# Patient Record
Sex: Female | Born: 1980 | Hispanic: Yes | Marital: Single
Health system: Southern US, Community
[De-identification: ages and names within clinical notes are randomized; demographics above are authoritative.]

---

## 2004-06-10 ENCOUNTER — Ambulatory Visit: Payer: Self-pay | Admitting: Family Medicine

## 2004-07-27 ENCOUNTER — Observation Stay: Payer: Self-pay | Admitting: Obstetrics and Gynecology

## 2004-08-01 ENCOUNTER — Observation Stay: Payer: Self-pay | Admitting: Obstetrics and Gynecology

## 2004-09-08 ENCOUNTER — Inpatient Hospital Stay: Payer: Self-pay

## 2007-04-26 ENCOUNTER — Emergency Department: Payer: Self-pay | Admitting: Emergency Medicine

## 2007-04-28 ENCOUNTER — Emergency Department: Payer: Self-pay | Admitting: Emergency Medicine

## 2007-04-29 ENCOUNTER — Emergency Department: Payer: Self-pay | Admitting: Emergency Medicine

## 2013-12-29 ENCOUNTER — Encounter: Payer: Self-pay | Admitting: Obstetrics and Gynecology

## 2014-01-06 ENCOUNTER — Emergency Department: Payer: Self-pay | Admitting: Emergency Medicine

## 2014-01-06 LAB — URINALYSIS, COMPLETE
Bacteria: NONE SEEN
Bilirubin,UR: NEGATIVE
GLUCOSE, UR: NEGATIVE mg/dL (ref 0–75)
KETONE: NEGATIVE
Leukocyte Esterase: NEGATIVE
Nitrite: NEGATIVE
Ph: 6 (ref 4.5–8.0)
Protein: NEGATIVE
RBC,UR: 1 /HPF (ref 0–5)
Specific Gravity: 1.015 (ref 1.003–1.030)

## 2014-01-06 LAB — CBC
HCT: 37.5 % (ref 35.0–47.0)
HGB: 12.4 g/dL (ref 12.0–16.0)
MCH: 30.6 pg (ref 26.0–34.0)
MCHC: 33 g/dL (ref 32.0–36.0)
MCV: 93 fL (ref 80–100)
Platelet: 148 10*3/uL — ABNORMAL LOW (ref 150–440)
RBC: 4.04 10*6/uL (ref 3.80–5.20)
RDW: 13.1 % (ref 11.5–14.5)
WBC: 9.5 10*3/uL (ref 3.6–11.0)

## 2014-01-06 LAB — COMPREHENSIVE METABOLIC PANEL
ANION GAP: 6 — AB (ref 7–16)
Albumin: 3.4 g/dL (ref 3.4–5.0)
Alkaline Phosphatase: 43 U/L — ABNORMAL LOW
BUN: 8 mg/dL (ref 7–18)
Bilirubin,Total: 0.4 mg/dL (ref 0.2–1.0)
CREATININE: 0.42 mg/dL — AB (ref 0.60–1.30)
Calcium, Total: 8.6 mg/dL (ref 8.5–10.1)
Chloride: 104 mmol/L (ref 98–107)
Co2: 24 mmol/L (ref 21–32)
EGFR (Non-African Amer.): >60
GLUCOSE: 84 mg/dL (ref 65–99)
OSMOLALITY: 266 (ref 275–301)
Potassium: 3.4 mmol/L — ABNORMAL LOW (ref 3.5–5.1)
SGOT(AST): 17 U/L (ref 15–37)
SGPT (ALT): 23 U/L (ref 12–78)
SODIUM: 134 mmol/L — AB (ref 136–145)
TOTAL PROTEIN: 7.1 g/dL (ref 6.4–8.2)

## 2014-02-03 ENCOUNTER — Emergency Department: Payer: Self-pay | Admitting: Emergency Medicine

## 2014-02-03 LAB — CBC WITH DIFFERENTIAL/PLATELET
BASOS ABS: 0 10*3/uL (ref 0.0–0.1)
BASOS PCT: 0.3 %
EOS ABS: 0.1 10*3/uL (ref 0.0–0.7)
EOS PCT: 0.8 %
HCT: 34.1 % — ABNORMAL LOW (ref 35.0–47.0)
HGB: 11.2 g/dL — ABNORMAL LOW (ref 12.0–16.0)
LYMPHS PCT: 25.7 %
Lymphocyte #: 1.9 10*3/uL (ref 1.0–3.6)
MCH: 30.9 pg (ref 26.0–34.0)
MCHC: 33 g/dL (ref 32.0–36.0)
MCV: 94 fL (ref 80–100)
Monocyte #: 0.6 x10 3/mm (ref 0.2–0.9)
Monocyte %: 7.6 %
NEUTROS ABS: 4.8 10*3/uL (ref 1.4–6.5)
Neutrophil %: 65.6 %
PLATELETS: 137 10*3/uL — AB (ref 150–440)
RBC: 3.64 10*6/uL — AB (ref 3.80–5.20)
RDW: 13.7 % (ref 11.5–14.5)
WBC: 7.3 10*3/uL (ref 3.6–11.0)

## 2014-02-03 LAB — URINALYSIS, COMPLETE
BACTERIA: NONE SEEN
BLOOD: NEGATIVE
Bilirubin,UR: NEGATIVE
Glucose,UR: 50 mg/dL (ref 0–75)
Ketone: NEGATIVE
Nitrite: NEGATIVE
Ph: 6 (ref 4.5–8.0)
Protein: NEGATIVE
RBC,UR: 1 /HPF (ref 0–5)
Specific Gravity: 1.028 (ref 1.003–1.030)
WBC UR: 9 /HPF (ref 0–5)

## 2014-02-03 LAB — COMPREHENSIVE METABOLIC PANEL
ALT: 28 U/L (ref 12–78)
ANION GAP: 1 — AB (ref 7–16)
AST: 30 U/L (ref 15–37)
Albumin: 3.2 g/dL — ABNORMAL LOW (ref 3.4–5.0)
Alkaline Phosphatase: 59 U/L
BUN: 9 mg/dL (ref 7–18)
Bilirubin,Total: 0.4 mg/dL (ref 0.2–1.0)
CHLORIDE: 110 mmol/L — AB (ref 98–107)
CREATININE: 0.67 mg/dL (ref 0.60–1.30)
Calcium, Total: 8.3 mg/dL — ABNORMAL LOW (ref 8.5–10.1)
Co2: 25 mmol/L (ref 21–32)
EGFR (Non-African Amer.): >60
Glucose: 94 mg/dL (ref 65–99)
OSMOLALITY: 270 (ref 275–301)
POTASSIUM: 3.1 mmol/L — AB (ref 3.5–5.1)
Sodium: 136 mmol/L (ref 136–145)
TOTAL PROTEIN: 7.1 g/dL (ref 6.4–8.2)

## 2014-02-03 LAB — HCG, QUANTITATIVE, PREGNANCY: BETA HCG, QUANT.: 14634 m[IU]/mL — AB

## 2014-02-09 ENCOUNTER — Encounter: Payer: Self-pay | Admitting: Obstetrics and Gynecology

## 2014-05-08 ENCOUNTER — Observation Stay: Payer: Self-pay | Admitting: Obstetrics and Gynecology

## 2014-05-08 LAB — COMPREHENSIVE METABOLIC PANEL
ALK PHOS: 103 U/L
ALT: 21 U/L
AST: 19 U/L (ref 15–37)
Albumin: 2.4 g/dL — ABNORMAL LOW (ref 3.4–5.0)
Anion Gap: 7 (ref 7–16)
BUN: 9 mg/dL (ref 7–18)
Bilirubin,Total: 0.4 mg/dL (ref 0.2–1.0)
CALCIUM: 8 mg/dL — AB (ref 8.5–10.1)
CREATININE: 0.53 mg/dL — AB (ref 0.60–1.30)
Chloride: 107 mmol/L (ref 98–107)
Co2: 25 mmol/L (ref 21–32)
EGFR (African American): >60
EGFR (Non-African Amer.): >60
Glucose: 109 mg/dL — ABNORMAL HIGH (ref 65–99)
Osmolality: 277 (ref 275–301)
Potassium: 4 mmol/L (ref 3.5–5.1)
SODIUM: 139 mmol/L (ref 136–145)
Total Protein: 5.9 g/dL — ABNORMAL LOW (ref 6.4–8.2)

## 2014-05-08 LAB — URINALYSIS, COMPLETE
Bilirubin,UR: NEGATIVE
Blood: NEGATIVE
Glucose,UR: NEGATIVE mg/dL (ref 0–75)
Ketone: NEGATIVE
Leukocyte Esterase: NEGATIVE
NITRITE: NEGATIVE
Ph: 6 (ref 4.5–8.0)
RBC,UR: 5 /HPF (ref 0–5)
SPECIFIC GRAVITY: 1.026 (ref 1.003–1.030)
Squamous Epithelial: 6

## 2014-05-08 LAB — AMYLASE: AMYLASE: 88 U/L (ref 25–115)

## 2014-05-08 LAB — LIPASE, BLOOD: Lipase: 242 U/L (ref 73–393)

## 2014-05-09 LAB — BASIC METABOLIC PANEL
ANION GAP: 9 (ref 7–16)
BUN: 7 mg/dL (ref 7–18)
CHLORIDE: 112 mmol/L — AB (ref 98–107)
Calcium, Total: 7.3 mg/dL — ABNORMAL LOW (ref 8.5–10.1)
Co2: 22 mmol/L (ref 21–32)
Creatinine: 0.58 mg/dL — ABNORMAL LOW (ref 0.60–1.30)
EGFR (Non-African Amer.): >60
GLUCOSE: 87 mg/dL (ref 65–99)
OSMOLALITY: 282 (ref 275–301)
Potassium: 3.7 mmol/L (ref 3.5–5.1)
SODIUM: 143 mmol/L (ref 136–145)

## 2014-05-09 LAB — CBC WITH DIFFERENTIAL/PLATELET
BASOS ABS: 0.1 10*3/uL (ref 0.0–0.1)
Basophil %: 0.7 %
Eosinophil #: 0.1 10*3/uL (ref 0.0–0.7)
Eosinophil %: 1.9 %
HCT: 27.9 % — AB (ref 35.0–47.0)
HGB: 9.2 g/dL — ABNORMAL LOW (ref 12.0–16.0)
LYMPHS ABS: 2.3 10*3/uL (ref 1.0–3.6)
LYMPHS PCT: 31.6 %
MCH: 30.3 pg (ref 26.0–34.0)
MCHC: 32.9 g/dL (ref 32.0–36.0)
MCV: 92 fL (ref 80–100)
MONOS PCT: 9.1 %
Monocyte #: 0.7 x10 3/mm (ref 0.2–0.9)
NEUTROS PCT: 56.7 %
Neutrophil #: 4.2 10*3/uL (ref 1.4–6.5)
PLATELETS: 148 10*3/uL — AB (ref 150–440)
RBC: 3.02 10*6/uL — ABNORMAL LOW (ref 3.80–5.20)
RDW: 13.1 % (ref 11.5–14.5)
WBC: 7.4 10*3/uL (ref 3.6–11.0)

## 2014-06-23 ENCOUNTER — Observation Stay: Payer: Self-pay | Admitting: Obstetrics and Gynecology

## 2014-06-29 ENCOUNTER — Encounter: Payer: Self-pay | Admitting: Maternal & Fetal Medicine

## 2014-06-29 LAB — URINALYSIS, COMPLETE
BACTERIA: NONE SEEN
BILIRUBIN, UR: NEGATIVE
GLUCOSE, UR: NEGATIVE mg/dL (ref 0–75)
Ketone: NEGATIVE
Nitrite: NEGATIVE
PROTEIN: NEGATIVE
Ph: 7 (ref 4.5–8.0)
RBC,UR: <1 /HPF (ref 0–5)
SPECIFIC GRAVITY: 1.004 (ref 1.003–1.030)
Squamous Epithelial: 7
WBC UR: 5 /HPF (ref 0–5)

## 2014-06-29 LAB — CBC WITH DIFFERENTIAL/PLATELET
BASOS PCT: 0.4 %
Basophil #: 0 10*3/uL (ref 0.0–0.1)
Eosinophil #: 0.4 10*3/uL (ref 0.0–0.7)
Eosinophil %: 4.3 %
HCT: 33.8 % — ABNORMAL LOW (ref 35.0–47.0)
HGB: 10.9 g/dL — ABNORMAL LOW (ref 12.0–16.0)
LYMPHS ABS: 1.5 10*3/uL (ref 1.0–3.6)
Lymphocyte %: 17.8 %
MCH: 29.4 pg (ref 26.0–34.0)
MCHC: 32.3 g/dL (ref 32.0–36.0)
MCV: 91 fL (ref 80–100)
MONOS PCT: 7.6 %
Monocyte #: 0.6 x10 3/mm (ref 0.2–0.9)
NEUTROS ABS: 5.9 10*3/uL (ref 1.4–6.5)
NEUTROS PCT: 69.9 %
PLATELETS: 193 10*3/uL (ref 150–440)
RBC: 3.72 10*6/uL — AB (ref 3.80–5.20)
RDW: 14.1 % (ref 11.5–14.5)
WBC: 8.5 10*3/uL (ref 3.6–11.0)

## 2014-06-29 LAB — HEPATIC FUNCTION PANEL A (ARMC)
AST: 33 U/L (ref 15–37)
Albumin: 2.8 g/dL — ABNORMAL LOW (ref 3.4–5.0)
Alkaline Phosphatase: 218 U/L — ABNORMAL HIGH
Bilirubin, Direct: <0.1 mg/dL (ref 0.0–0.2)
Bilirubin,Total: 0.5 mg/dL (ref 0.2–1.0)
SGPT (ALT): 27 U/L
Total Protein: 7 g/dL (ref 6.4–8.2)

## 2014-07-04 ENCOUNTER — Inpatient Hospital Stay: Payer: Self-pay | Admitting: Obstetrics and Gynecology

## 2014-07-04 LAB — CBC WITH DIFFERENTIAL/PLATELET
Basophil #: 0.1 10*3/uL (ref 0.0–0.1)
Basophil %: 1.1 %
Eosinophil #: 0 10*3/uL (ref 0.0–0.7)
Eosinophil %: 0.5 %
HCT: 34.4 % — ABNORMAL LOW (ref 35.0–47.0)
HGB: 11 g/dL — ABNORMAL LOW (ref 12.0–16.0)
Lymphocyte #: 2.4 10*3/uL (ref 1.0–3.6)
Lymphocyte %: 29.1 %
MCH: 28.9 pg (ref 26.0–34.0)
MCHC: 31.9 g/dL — ABNORMAL LOW (ref 32.0–36.0)
MCV: 91 fL (ref 80–100)
Monocyte #: 0.5 x10 3/mm (ref 0.2–0.9)
Monocyte %: 5.5 %
Neutrophil #: 5.3 10*3/uL (ref 1.4–6.5)
Neutrophil %: 63.8 %
Platelet: 181 10*3/uL (ref 150–440)
RBC: 3.79 10*6/uL — ABNORMAL LOW (ref 3.80–5.20)
RDW: 14.4 % (ref 11.5–14.5)
WBC: 8.3 10*3/uL (ref 3.6–11.0)

## 2014-07-05 LAB — HEMOGLOBIN: HGB: 10 g/dL — AB (ref 12.0–16.0)

## 2014-07-10 ENCOUNTER — Encounter: Payer: Self-pay | Admitting: Maternal & Fetal Medicine

## 2014-11-11 NOTE — Consult Note (Signed)
Referral Information:  Reason for Referral 34 yo gravida 4 para 3003 at [id number] is referred by [facility] due to bilirubinuria, proteinuria and episode of of coffee ground emesis in October.  Now with persistent nausea and vomiting in pregnancy which has been controlled with Zofran 4 mg po daily.  Vomited only once this past week.   Referring Physician [facility]   Prenatal Hx Bilirubinuria, proteinuria and episode of of coffee ground emesis in October.  Now with persistent nausea and vomiting in pregnancy which has been controlled with Zofran 4 mg po daily.  CBC, LFTs, were normal 06/06/2014.  Uric acid then was 3.2.  Bilrubin was 0.3.    A RUQ ultrasound on 05/09/2014 revealed mild liver infiltration   Past Obstetrical Hx 1. 01/29/2000 - spontaneous vaginal delivery of 3600 g female 2. 01/27/2001 - spontaneous vaginal delivery at home 3. 09/08/2004 - spontaneous vaginal delivery of 7 lb 5 oz female.  [facility] at [facility].  Pregnancy complicated by PTL treated twice.   Home Medications: Medication Instructions Status  ondansetron 4 mg oral tablet 1 tab(s) orally every 8 hours, As Needed as needed for vomiting Active  ferrous sulfate 325 mg oral tablet 1 tab(s) orally 2 times a day Active  Tylenol 500 mg oral tablet 2 tab(s) orally every 6 hours, As Needed - for Pain Active  Prenatal 1 Prenatal Multivitamins with Folic Acid 0.975 mg oral capsule 1 cap(s) orally once a day Active   Allergies:   No Known Allergies:   Vital Signs/Notes:  Nursing Vital Signs: **Vital Signs.:   10-Dec-15 13:52  Vital Signs Type Routine  Temperature Temperature (F) 98  Celsius 36.6  Temperature Source oral  Pulse Pulse 106  Respirations Respirations 18  Systolic BP Systolic BP 117  Diastolic BP (mmHg) Diastolic BP (mmHg) 77  Mean BP 90  Pulse Ox % Pulse Ox % 99  Pulse Ox Activity Level  At rest  Oxygen Delivery Room Air/ 21 %   Perinatal Consult:  PGyn Hx Benign   PMed Hx Benign   PSurg Hx None   FHx Mother -  healthy; Father - diabetes; Sister , brother, nieces, nephews, alive and well   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  Vomited only once this past week.   Fever/Chills No   Cough No   Abdominal Pain No   Diarrhea No   Constipation No   Nausea/Vomiting Yes   SOB/DOE No   Chest Pain No   Dysuria No   Tolerating Diet Yes   Medications/Allergies Reviewed Medications/Allergies reviewed   Exam:  Today's Weight 131 lbs; BMI=26   Neck normal   Lungs clear   Heart normal sinus rhythm, no murmurs   Back no costo vertebral angle tenderness   Abdomen soft, nontender, no masses, no liver or spleen enlargement     Routine UA:  10-Dec-15 15:00   Color (UA) Yellow  Clarity (UA) Clear  Glucose (UA) Negative  Bilirubin (UA) Negative  Ketones (UA) Negative  Specific Gravity (UA) 1.004  Blood (UA) 1+  pH (UA) 7.0  Protein (UA) Negative  Nitrite (UA) Negative  Leukocyte Esterase (UA) Trace (Result(s) reported on 29 Jun 2014 at 03:23PM.)  RBC (UA) <1 /HPF  WBC (UA) 5 /HPF  Bacteria (UA) NONE SEEN  Epithelial Cells (UA) 7 /HPF (Result(s) reported on 29 Jun 2014 at 03:23PM.)  Routine Hem:  10-Dec-15 15:00   WBC (  CBC) 8.5  RBC (CBC)  3.72  Hemoglobin (CBC)  10.9  Hematocrit (CBC)  33.8  Platelet Count (CBC) 193  MCV 91  MCH 29.4  MCHC 32.3  RDW 14.1  Neutrophil % 69.9  Lymphocyte % 17.8  Monocyte % 7.6  Eosinophil % 4.3  Basophil % 0.4  Neutrophil # 5.9  Lymphocyte # 1.5  Monocyte # 0.6  Eosinophil # 0.4  Basophil # 0.0 (Result(s) reported on 29 Jun 2014 at 04:29PM.)   Impression/Recommendations:  Impression 34 yo gravida 4 para 3003 at [redacted]w[redacted]d with third trimester nausea and vomiting; mild fatty infiltration on ultrasound; bilirubinuria, proteinuria and episode of of coffee ground emesis in October.  Persistent nausea and vomiting in pregnancy which has been  controlled with Zofran 4 mg po daily.  No bilirubinuria now.  No evidence of preeclampsia, HELLP syndrome, or acute fatty liver of pregnancy.   Recommendations 1.  LFTs were obtained today to make sure they remain normal 2.  She should continue to be monitored for preeclampsia 3. She should have follow-up with GI sometime postpartum, at least within the year, to follow-up her liver.  They will likely want to see a hepatits C study 4. We have scheduled an ultrasound for fetal growth and well-being in the event she is not delivered by 07/10/2014.    Total Time Spent with Patient 45 minutes   >50% of visit spent in couseling/coordination of care yes   Office Use Only [address]  Level 3 ( ) NEW office consult detailed   Coding Description: MATERNAL CONDITIONS/HISTORY INDICATION(S).   Hyperemesis gravidarium, > 22 weeks.   Liver disease in pregnancy.  Electronic Signatures: Lady Deutscher (MD)  (Signed 10-Dec-15 18:11)  Authored: Referral, Home Medications, Allergies, Vital Signs/Notes, Consult, Exam, Lab, Impression, Billing, Coding Description   Last Updated: 10-Dec-15 18:11 by Lady Deutscher (MD)

## 2016-04-14 IMAGING — US ABDOMEN ULTRASOUND LIMITED
1 series · 14 of 25 positions shown · non-contrast
Comparison: 04/28/2007

CLINICAL DATA: Nausea and vomiting for 2 days, 31 weeks pregnant

EXAM:
US ABDOMEN LIMITED - RIGHT UPPER QUADRANT

[Series 1: abdomen ultrasound limited · 0.26mm/px · 14 of 59 slices shown]
[im 1/59]
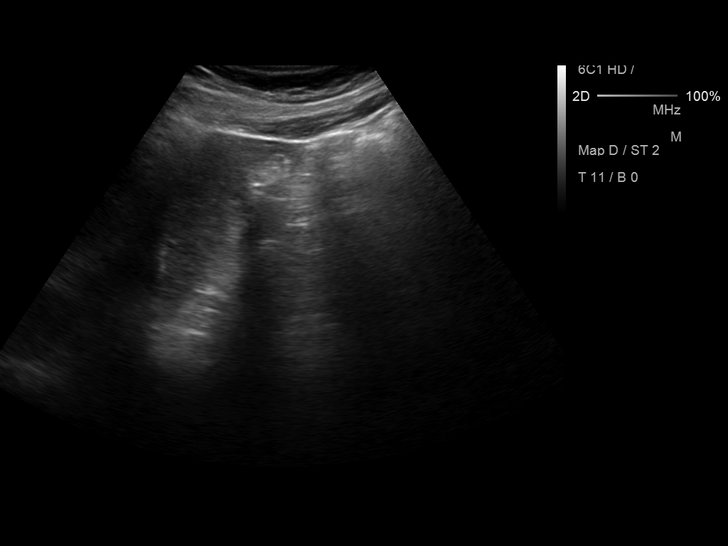
[im 5/59]
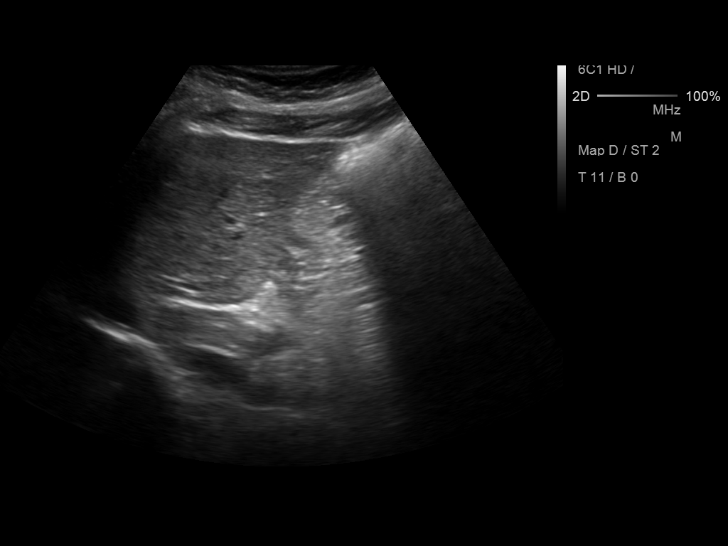
[im 10/59]
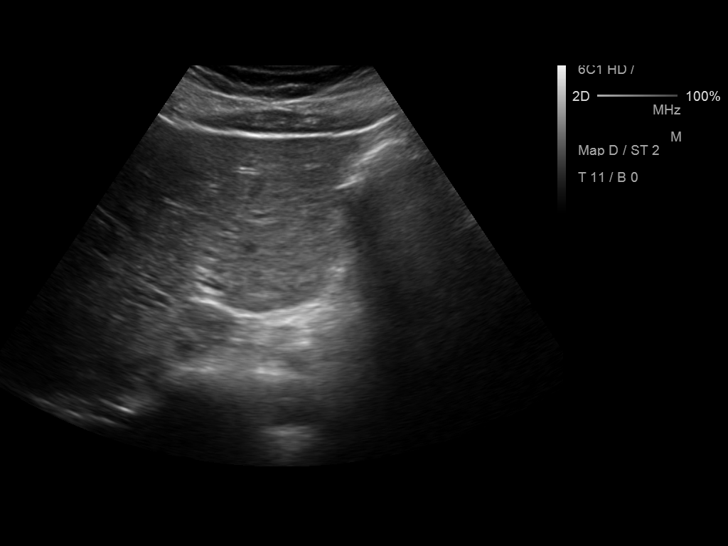
[im 15/59]
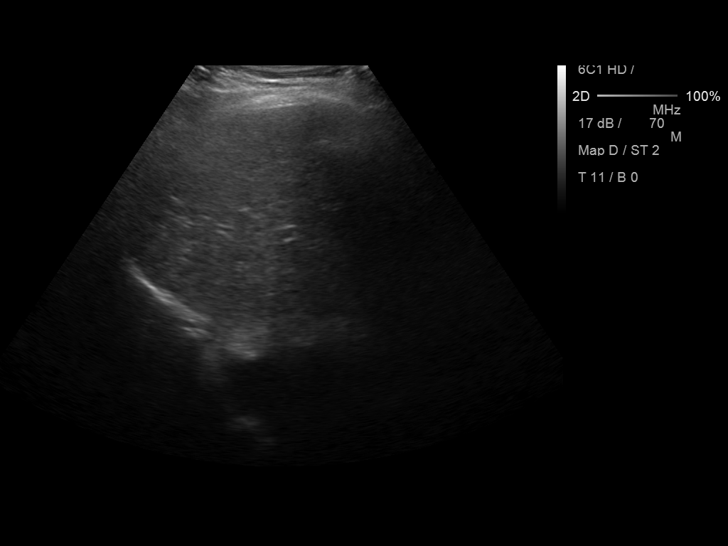
[im 20/59]
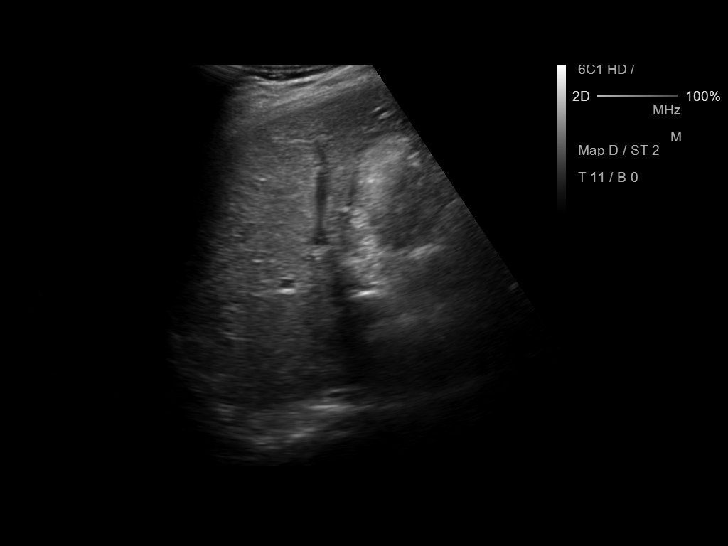
[im 22/59]
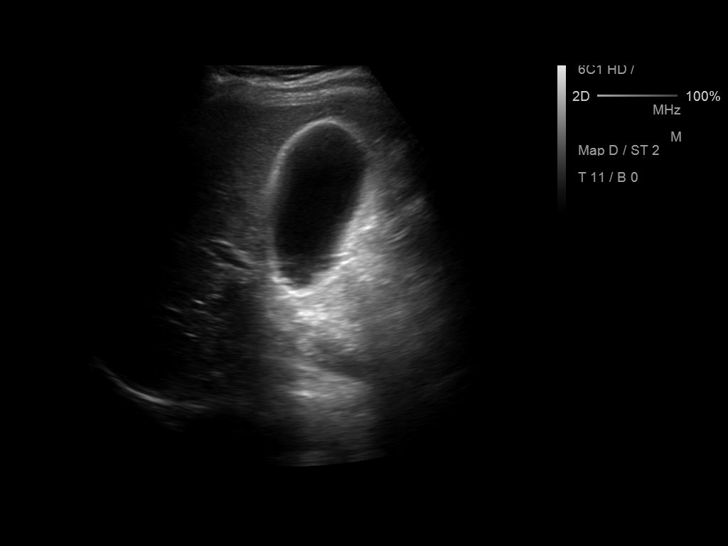
[im 27/59]
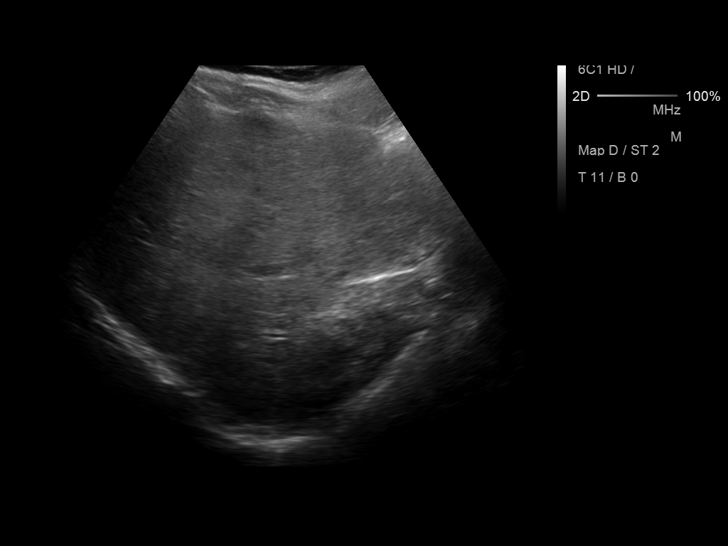
[im 32/59]
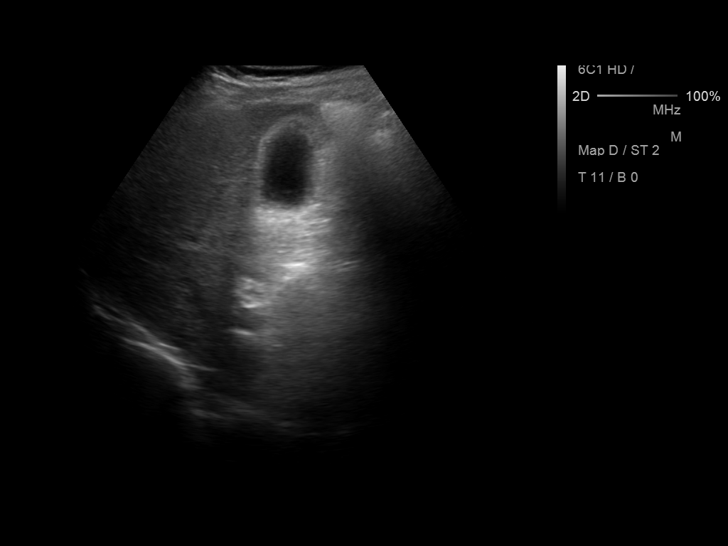
[im 37/59]
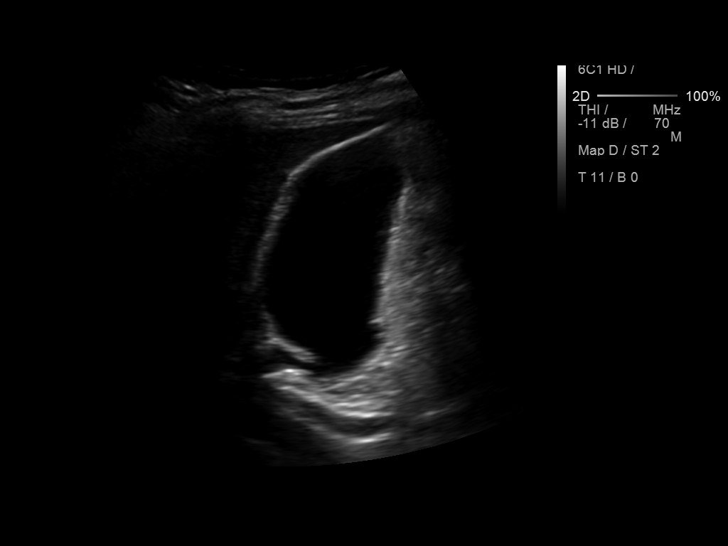
[im 39/59]
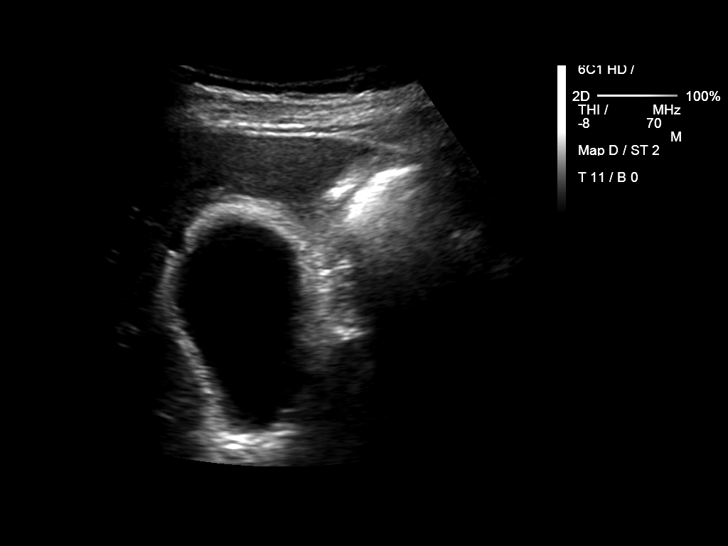
[im 44/59]
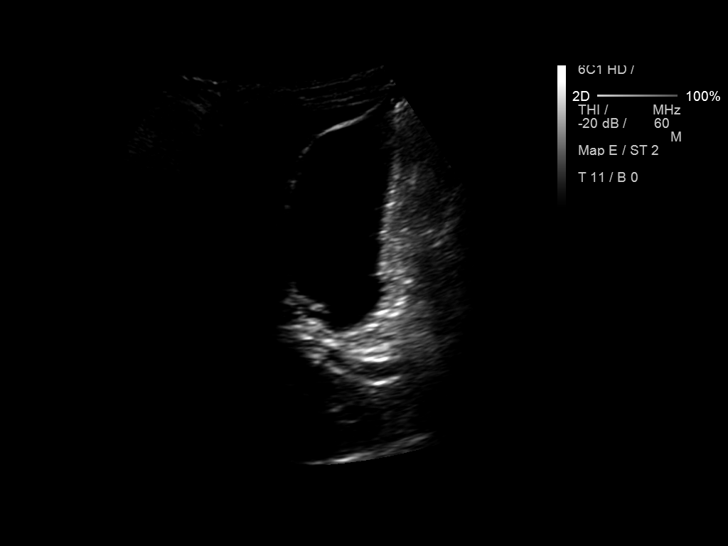
[im 49/59]
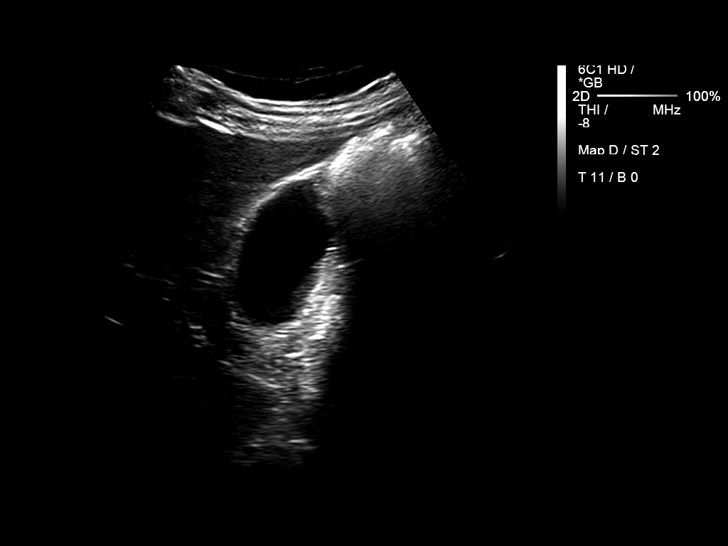
[im 54/59]
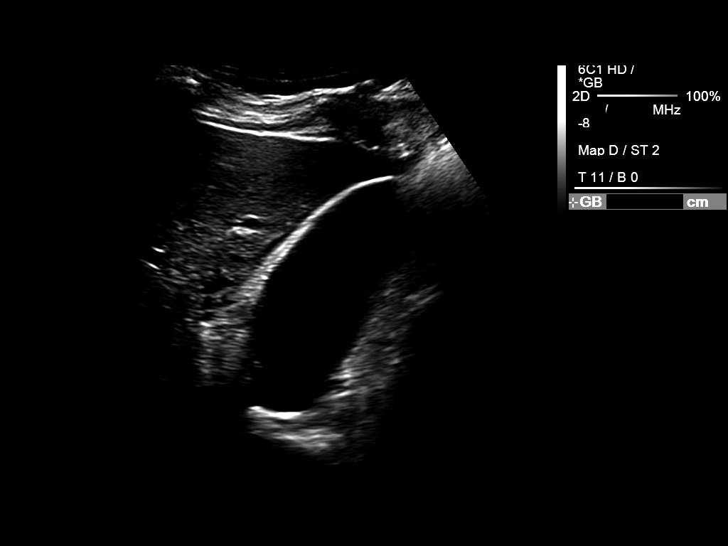
[im 59/59]
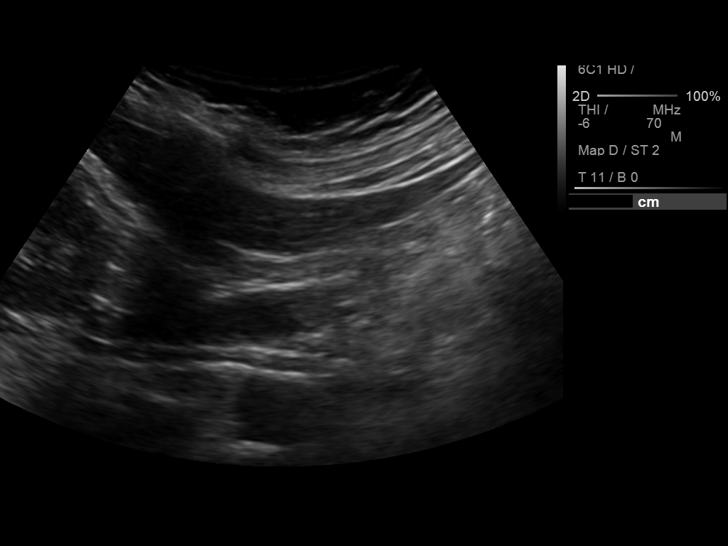

[14 of 25 positions shown; findings below may reference images not displayed]

FINDINGS: Gallbladder:

Normally distended without stones or wall thickening.

No pericholecystic fluid or sonographic Murphy sign.

Common bile duct:

Diameter: Normal caliber 5 mm diameter

Liver:

Normal morphology. Slightly increased echogenicity suggesting fatty
infiltration. No focal mass or nodularity.

No RIGHT upper quadrant free fluid.
IMPRESSION: Question mild fatty infiltration of liver.

Otherwise negative exam.

## 2019-01-01 ENCOUNTER — Encounter: Payer: Self-pay | Admitting: Emergency Medicine

## 2019-01-01 ENCOUNTER — Other Ambulatory Visit: Payer: Self-pay

## 2019-01-01 ENCOUNTER — Emergency Department
Admission: EM | Admit: 2019-01-01 | Discharge: 2019-01-01 | Disposition: A | Discharge status: Home / Self Care | Payer: 59 | Attending: Emergency Medicine | Admitting: Emergency Medicine

## 2019-01-01 DIAGNOSIS — U071 COVID-19: Secondary | ICD-10-CM | POA: Insufficient documentation

## 2019-01-01 DIAGNOSIS — Z20822 Contact with and (suspected) exposure to covid-19: Secondary | ICD-10-CM

## 2019-01-01 DIAGNOSIS — R51 Headache: Secondary | ICD-10-CM | POA: Diagnosis present

## 2019-01-01 NOTE — Discharge Instructions (Addendum)
Please seek medical attention for any high fevers, chest pain, shortness of breath, change in behavior, persistent vomiting, bloody stool or any other new or concerning symptoms.  

## 2019-01-01 NOTE — ED Provider Notes (Signed)
[facility] Emergency Department Provider Note  ____________________________________________   I have reviewed the triage vital signs and the nursing notes.   HISTORY  Chief Complaint Would like covid test  History limited by: Not Limited   HPI Yvonne Green is a 38 y.o. female who presents to the emergency department today because of concerns for possible covid and requesting a test. She states that her brother has tested positive and she has had close encounters with him. For the past three days she says she has had headache and has lost her sense of taste. The patient denies any fevers or shortness of breath. Denies any lung disease history.    Records reviewed. Per medical record review no recent ER visits.   History reviewed. No pertinent past medical history.  There are no active problems to display for this patient.   History reviewed. No pertinent surgical history.  Prior to Admission medications   Not on File    Allergies Patient has no known allergies.  No family history on file.  Social History Social History   Tobacco Use  . Smoking status: Not on file  Substance Use Topics  . Alcohol use: Not on file  . Drug use: Not on file    Review of Systems Constitutional: No fever/chills Eyes: No visual changes. ENT: No sore throat. Cardiovascular: Denies chest pain. Respiratory: Denies shortness of breath. Gastrointestinal: No abdominal pain.  No nausea, no vomiting.  No diarrhea.   Genitourinary: Negative for dysuria. Musculoskeletal: Negative for back pain. Skin: Negative for rash. Neurological: Positive for loss of taste.  ____________________________________________   PHYSICAL EXAM:  VITAL SIGNS: ED Triage Vitals  Enc Vitals Group     BP 01/01/19 1450 129/90     Pulse Rate 01/01/19 1450 (!) 106     Resp 01/01/19 1450 16     Temp 01/01/19 1450 98.6 F (37 C)     Temp Source 01/01/19 1450 Oral     SpO2  01/01/19 1450 100 %     Weight --      Height --      Head Circumference --      Peak Flow --      Pain Score 01/01/19 1448 0   Constitutional: Alert and oriented.  Eyes: Conjunctivae are normal.  ENT      Head: Normocephalic and atraumatic.      Nose: No congestion/rhinnorhea.      Mouth/Throat: Mucous membranes are moist.      Neck: No stridor. Hematological/Lymphatic/Immunilogical: No cervical lymphadenopathy. Cardiovascular: Normal rate, regular rhythm.  No murmurs, rubs, or gallops.  Respiratory: Normal respiratory effort without tachypnea nor retractions. Breath sounds are clear and equal bilaterally. No wheezes/rales/rhonchi. Gastrointestinal: Soft and non tender. No rebound. No guarding.  Genitourinary: Deferred Musculoskeletal: Normal range of motion in all extremities.  Neurologic:  Normal speech and language. No gross focal neurologic deficits are appreciated.  Skin:  Skin is warm, dry and intact. No rash noted. Psychiatric: Mood and affect are normal. Speech and behavior are normal. Patient exhibits appropriate insight and judgment.  ____________________________________________    LABS (pertinent positives/negatives)  Covid sent  ____________________________________________   EKG  None  ____________________________________________    RADIOLOGY  None  ____________________________________________   PROCEDURES  Procedures  ____________________________________________   INITIAL IMPRESSION / ASSESSMENT AND PLAN / ED COURSE  Pertinent labs & imaging results that were available during my care of the patient were reviewed by me and considered in my medical decision  making (see chart for details).   Patient presented to the emergency department today because of concerns for possible COVID disease and requesting testing.  She does have close encounter with someone else who is test positive and has symptoms of headache and taste loss.  Will send COVID test.   Discussed with patient that she should treat herself as being COVID positive and should self isolate.  Discussed return precautions.  ____________________________________________   FINAL CLINICAL IMPRESSION(S) / ED DIAGNOSES  Final diagnoses:  Suspected Covid-19 Virus Infection     Note: This dictation was prepared with Dragon dictation. Any transcriptional errors that result from this process are unintentional     Nance Pear, MD 01/01/19 1529

## 2019-01-01 NOTE — ED Triage Notes (Signed)
Pt to ED requesting testing for COVID. Pts brother tested positive. Pt has had loss of smell, loss of taste, and HA.

## 2019-01-03 ENCOUNTER — Telehealth: Payer: Self-pay | Admitting: Emergency Medicine

## 2019-01-03 NOTE — Telephone Encounter (Signed)
Called patient to make sure she is aware of covid 19 results negative.  Unable to contact.  Number is incorrect.

## 2019-01-07 LAB — NOVEL CORONAVIRUS, NAA (HOSP ORDER, SEND-OUT TO REF LAB; TAT 18-24 HRS): SARS-CoV-2, NAA: DETECTED — AB
